# Patient Record
Sex: Female | Born: 1955
Health system: Southern US, Community
[De-identification: ages and names within clinical notes are randomized; demographics above are authoritative.]

## PROBLEM LIST (undated history)

## (undated) DIAGNOSIS — I10 Essential (primary) hypertension: Secondary | ICD-10-CM

## (undated) DIAGNOSIS — M1990 Unspecified osteoarthritis, unspecified site: Secondary | ICD-10-CM

## (undated) DIAGNOSIS — F419 Anxiety disorder, unspecified: Secondary | ICD-10-CM

## (undated) DIAGNOSIS — K219 Gastro-esophageal reflux disease without esophagitis: Secondary | ICD-10-CM

## (undated) DIAGNOSIS — F32A Depression, unspecified: Secondary | ICD-10-CM

## (undated) DIAGNOSIS — F329 Major depressive disorder, single episode, unspecified: Secondary | ICD-10-CM

## (undated) DIAGNOSIS — Z9989 Dependence on other enabling machines and devices: Secondary | ICD-10-CM

## (undated) DIAGNOSIS — G4733 Obstructive sleep apnea (adult) (pediatric): Secondary | ICD-10-CM

## (undated) HISTORY — DX: Dependence on other enabling machines and devices: Z99.89

## (undated) HISTORY — PX: TONSILLECTOMY: SUR1361

## (undated) HISTORY — DX: Major depressive disorder, single episode, unspecified: F32.9

## (undated) HISTORY — DX: Unspecified osteoarthritis, unspecified site: M19.90

## (undated) HISTORY — DX: Essential (primary) hypertension: I10

## (undated) HISTORY — DX: Gastro-esophageal reflux disease without esophagitis: K21.9

## (undated) HISTORY — DX: Depression, unspecified: F32.A

## (undated) HISTORY — PX: LIPOMA EXCISION: SHX5283

## (undated) HISTORY — DX: Anxiety disorder, unspecified: F41.9

## (undated) HISTORY — PX: OTHER SURGICAL HISTORY: SHX169

## (undated) HISTORY — PX: ROTATOR CUFF REPAIR: SHX139

## (undated) HISTORY — DX: Obstructive sleep apnea (adult) (pediatric): G47.33

---

## 2016-09-15 ENCOUNTER — Other Ambulatory Visit: Payer: Self-pay | Admitting: Internal Medicine

## 2016-09-15 DIAGNOSIS — Z1231 Encounter for screening mammogram for malignant neoplasm of breast: Secondary | ICD-10-CM

## 2016-10-08 ENCOUNTER — Ambulatory Visit
Admission: RE | Admit: 2016-10-08 | Discharge: 2016-10-08 | Disposition: A | Discharge status: Home / Self Care | Payer: 59 | Source: Ambulatory Visit | Attending: Internal Medicine | Admitting: Internal Medicine

## 2016-10-08 DIAGNOSIS — Z1231 Encounter for screening mammogram for malignant neoplasm of breast: Secondary | ICD-10-CM

## 2016-10-11 HISTORY — PX: BREAST BIOPSY: SHX20

## 2016-10-27 ENCOUNTER — Institutional Professional Consult (permissible substitution): Payer: 59 | Admitting: Neurology

## 2016-10-28 ENCOUNTER — Other Ambulatory Visit: Payer: Self-pay | Admitting: Internal Medicine

## 2016-10-28 DIAGNOSIS — R928 Other abnormal and inconclusive findings on diagnostic imaging of breast: Secondary | ICD-10-CM

## 2016-10-29 ENCOUNTER — Telehealth: Payer: Self-pay

## 2016-10-29 NOTE — Telephone Encounter (Signed)
I called pt. She is agreeable to rescheduling her appt for 11/02/2016 at 2:00pm. Pt verbalized understanding understanding of new appt date and time.

## 2016-10-29 NOTE — Telephone Encounter (Signed)
I called pt to advise her that her appt on 10/27/16 will need to be rescheduled due to weather. Pt verbalized understanding.

## 2016-11-01 ENCOUNTER — Ambulatory Visit
Admission: RE | Admit: 2016-11-01 | Discharge: 2016-11-01 | Disposition: A | Discharge status: Home / Self Care | Payer: 59 | Source: Ambulatory Visit | Attending: Internal Medicine | Admitting: Internal Medicine

## 2016-11-01 ENCOUNTER — Other Ambulatory Visit: Payer: Self-pay | Admitting: Internal Medicine

## 2016-11-01 DIAGNOSIS — R928 Other abnormal and inconclusive findings on diagnostic imaging of breast: Secondary | ICD-10-CM

## 2016-11-01 DIAGNOSIS — R921 Mammographic calcification found on diagnostic imaging of breast: Secondary | ICD-10-CM | POA: Diagnosis not present

## 2016-11-02 ENCOUNTER — Ambulatory Visit (INDEPENDENT_AMBULATORY_CARE_PROVIDER_SITE_OTHER): Payer: 59 | Admitting: Neurology

## 2016-11-02 ENCOUNTER — Encounter: Payer: Self-pay | Admitting: Neurology

## 2016-11-02 VITALS — BP 134/72 | HR 57 | Resp 20 | Ht 66.0 in | Wt 249.0 lb

## 2016-11-02 DIAGNOSIS — G4733 Obstructive sleep apnea (adult) (pediatric): Secondary | ICD-10-CM

## 2016-11-02 DIAGNOSIS — Z9989 Dependence on other enabling machines and devices: Secondary | ICD-10-CM | POA: Diagnosis not present

## 2016-11-02 NOTE — Progress Notes (Signed)
SLEEP MEDICINE CLINIC   Provider:  Larey Seat, M D  Referring Provider: Haywood Pao, MD Primary Care Physician:  Haywood Pao, MD  Chief Complaint  Patient presents with  . New Patient (Initial Visit)    on cpap for 10 years    HPI:  Brittany Henson is a 61 y.o. female , seen here as a referral from Dr. Osborne Casco for transfer of sleep care,  Mrs. Wilms moved to  [place] , from [place] last year and needs to answer her sleep care. Her machine is a little over 87 years old, a ResMed machine S 8 provided by [employer]. It set at 11 cm water without ramp and was last set on 02/05/2007. She has been a compliant CPAP user, endorsed the Epworth Sleepiness Scale while on treatment at one point and fatigue severity at 40 points. She endorsed further coughing, some hearing loss, joint pain, rhinitis and a history of depression and hypertension and GERD. Just undergone tonsillectomy in 1962, rotator cuff repair on the right in 2015, and a lipoma was removed from the neck 2016.  Chief complaint according to patient : I need a new CPAP.   Sleep habits are as follows: She goes to bed at about 11:00 at night, and she sleeps usually until about 8 AM she can sleep through the night on CPAP. No nocturia breaks but she had at least 2 bathroom breaks before she was started on CPAP. She keeps her bedroom cool, quiet and there are some night lights. She sleeps usually on 2 pillows for neck support and sleeps on her back. She awakens in the morning with out dry mouth or headaches and usually does dream, she does not have palpitations or diaphoresis. Again, this is her sleep experience since using CPAP.  Sleep medical history and family sleep history:  Her mother use to snore loudly but was never officially diagnosed with sleep apnea, she died at age 29 from a stroke.patient had panic attacks at night, depression .   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  She states that she gained weight once she stopped working regularly. Coffee one cup a day, iced tea,  Sweet -  4 glasses a day, Sodas : seldomly.  ETOH : None, tobacco use; none No history of night shift work. Married, no children. She owns 2 dogs and 2 cats.  Review of Systems: Out of a complete 14 system review, the patient complains of only the following symptoms, and all other reviewed systems are negative. Epworth score  1, Fatigue severity score 40, depression score 3/10 . Hypersomnia before CPAP. Nocturia before CPAP.    Social History   Social History  . Marital status: Married    Spouse name: N/A  . Number of children: N/A  . Years of education: N/A   Occupational History  . Not on file.   Social History Main Topics  . Smoking status: Never Smoker  . Smokeless tobacco: Never Used  . Alcohol use No  . Drug use: No  . Sexual activity: Not on file   Other Topics Concern  . Not on file   Social History Narrative  . No narrative on file    Family History  Problem Relation Age of Onset  . Arthritis Mother   . Asthma Mother   . Depression Mother   . Heart disease Mother   . Hyperlipidemia Mother   . Seizures Mother   . Stroke  Mother   . Hypertension Father   . Diabetes Maternal Aunt   . Heart disease Maternal Grandmother   . Breast cancer Maternal Grandmother     Past Medical History:  Diagnosis Date  . Anxiety   . Depression   . GERD (gastroesophageal reflux disease)   . Hypertension   . OSA on CPAP   . Osteoarthritis     Past Surgical History:  Procedure Laterality Date  . LIPOMA EXCISION    . ROTATOR CUFF REPAIR    . TONSILLECTOMY    . uterine fibroidectomy      Current Outpatient Prescriptions  Medication Sig Dispense Refill  . bisoprolol-hydrochlorothiazide (ZIAC) 2.5-6.25 MG tablet Take 1 tablet by mouth daily.    Marland Kitchen esomeprazole (NEXIUM) 40 MG capsule Take 40 mg by mouth daily at 12 noon.    Marland Kitchen FLUoxetine (PROZAC) 40 MG capsule Take 40  mg by mouth daily.     No current facility-administered medications for this visit.     Allergies as of 11/02/2016 - Review Complete 11/02/2016  Allergen Reaction Noted  . Erythromycin Nausea And Vomiting 09/10/2011    Vitals: BP 134/72   Pulse (!) 57   Resp 20   Ht 5\' 6"  (1.676 m)   Wt 249 lb (112.9 kg)   BMI 40.19 kg/m  Last Weight:  Wt Readings from Last 1 Encounters:  11/02/16 249 lb (112.9 kg)   [id number] mass index is 40.19 kg/m.     Last Height:   Ht Readings from Last 1 Encounters:  11/02/16 5\' 6"  (1.676 m)    Physical exam:  General: The patient is awake, alert and appears not in acute distress. The patient is well groomed. Head: Normocephalic, atraumatic. Neck is supple. Mallampati 4,  neck circumference: 16. Nasal airflow patent , Retrognathia is not seen.  Cardiovascular:  Regular rate and rhythm , without  murmurs or carotid bruit, and without distended neck veins. Respiratory: Lungs are clear to auscultation. Skin:  Without evidence of edema, or rash Trunk: BMI is super obese . The patient's posture is erect   Neurologic exam : The patient is awake and alert, oriented to place and time.   Attention span & concentration ability appears normal.  Speech is fluent,  without  dysarthria, dysphonia or aphasia.  Mood and affect are appropriate.  Cranial nerves: no loss of smell or taste  Pupils are equal and briskly reactive to light. Funduscopic exam without evidence of pallor or edema. Extraocular movements  in vertical and horizontal planes intact and without nystagmus. Visual fields by finger perimetry are intact. Hearing to finger rub intact. Facial sensation intact to fine touch.Facial motor strength is symmetric and tongue and uvula move midline. Shoulder shrug : The right shoulder is droopier than the left sits lower at baseline, this is the site where she had rotator injuries to be surgically repaired Motor exam:   Normal tone, muscle bulk and symmetric  strength in all extremities. Sensory:  Fine touch, pinprick and vibration were tested in all extremities. Proprioception tested in the upper extremities was normal. Coordination:Finger-to-nose maneuver  normal without evidence of ataxia, dysmetria . Action tremor in left arm and hand.  Gait and station: Patient walks without assistive device and is able unassisted to climb up to the exam table. Strength within normal limits.  Stance is stable and normal.   Deep tendon reflexes: in the  upper and lower extremities are symmetric and intact. Babinski maneuver response is  downgoing.  The patient was  advised of the nature of the diagnosed sleep disorder , the treatment options and risks for general a health and wellness arising from not treating the condition.  I spent more than 45  minutes of face to face time with the patient. Greater than 50% of time was spent in counseling and coordination of care. We have discussed the diagnosis and differential and I answered the patient's questions.     Assessment:  After physical and neurologic examination, review of laboratory studies,  Personal review of imaging studies, reports of other /same  Imaging studies ,  Results of a polysomnography were not available.  Neurophysiology testing and pre-existing records as far as provided in visit., my assessment is   1) Mrs. Trani is likely to still have sleep apnea, given that she gained about 35 pounds since she was initially diagnosed with sleep apnea 10 years ago. In addition she is using a CPAP is great compliance but cannot provide therapeutic or compliance data, both are requirements by now for most insurances. For this reason I will ask Mrs. Digirolamo to come in for a split night polysomnography so we can establish the baseline degree of apnea and titrated her to CPAP if necessary. I will order the sleep study to be performed at [facility] sleep at [facility], in addition I will give the patient some advice about  weight loss and sleep hygiene. For the most part she is adhering to the basic rules very well. I will ask her to reduce caffeine intake and to use caffeine in any form he for lunchtime but not into the afternoon.  Her risk factors further include her neck size, in the upper airway shape and BMI.   Plan:  Treatment plan and additional workup :  Split  and  retitration.    Asencion Partridge Bertine Schlottman MD  11/02/2016   CC: Haywood Pao, [place] [place], [place] [address]

## 2016-11-02 NOTE — Patient Instructions (Signed)

## 2016-11-04 ENCOUNTER — Ambulatory Visit
Admission: RE | Admit: 2016-11-04 | Discharge: 2016-11-04 | Disposition: A | Discharge status: Home / Self Care | Payer: 59 | Source: Ambulatory Visit | Attending: Internal Medicine | Admitting: Internal Medicine

## 2016-11-04 ENCOUNTER — Other Ambulatory Visit: Payer: Self-pay | Admitting: Internal Medicine

## 2016-11-04 DIAGNOSIS — R928 Other abnormal and inconclusive findings on diagnostic imaging of breast: Secondary | ICD-10-CM

## 2016-11-04 DIAGNOSIS — R921 Mammographic calcification found on diagnostic imaging of breast: Secondary | ICD-10-CM | POA: Diagnosis not present

## 2016-11-04 DIAGNOSIS — D242 Benign neoplasm of left breast: Secondary | ICD-10-CM | POA: Diagnosis not present

## 2016-11-24 ENCOUNTER — Ambulatory Visit (INDEPENDENT_AMBULATORY_CARE_PROVIDER_SITE_OTHER): Payer: 59 | Admitting: Neurology

## 2016-11-24 DIAGNOSIS — G4733 Obstructive sleep apnea (adult) (pediatric): Secondary | ICD-10-CM | POA: Diagnosis not present

## 2016-11-24 DIAGNOSIS — Z9989 Dependence on other enabling machines and devices: Secondary | ICD-10-CM

## 2016-11-29 ENCOUNTER — Telehealth: Payer: Self-pay | Admitting: Neurology

## 2016-11-29 DIAGNOSIS — G4733 Obstructive sleep apnea (adult) (pediatric): Secondary | ICD-10-CM

## 2016-11-29 DIAGNOSIS — G4734 Idiopathic sleep related nonobstructive alveolar hypoventilation: Secondary | ICD-10-CM

## 2016-11-29 DIAGNOSIS — G4736 Sleep related hypoventilation in conditions classified elsewhere: Secondary | ICD-10-CM

## 2016-11-29 DIAGNOSIS — E669 Obesity, unspecified: Secondary | ICD-10-CM

## 2016-11-29 NOTE — Telephone Encounter (Signed)
Per insurance, AutoPAP is recommended.

## 2016-11-29 NOTE — Telephone Encounter (Signed)
Brittany Henson , please call and send report as always to referring physician. I ordered CPAP titration return, if denied will need auto-titration.  STUDY RESULTS: Total Recording Time: 9h 24m Total Apnea/Hypopnea Index (AHI) 56.4/hr. Average Oxygen Saturation: SpO2 at 91% Lowest Oxygen Saturation: nadir SpO2 at 78%, duration at or below 88% was 89 minutes! Average Heart Rate: 52 bpm, variable between 42 and 81 bpm  IMPRESSION:  Severest apnea, presumed to be obstructive, with severe oxygen desaturation (prolonged hypoxemia) and frequent desaturation events.  RECOMMENDATION: immediate need for positive airway pressure therapy, possibly additional Oxygen needed. I certify that I have reviewed the raw data recording prior to the issuance of this report in accordance with the standards of Accreditation of the [place] [employer] of [employer] medicine (AASM) Larey Seat, MD  11-29-2016

## 2016-11-29 NOTE — Telephone Encounter (Signed)
Can I get an Auto-PAP order?

## 2016-11-29 NOTE — Procedures (Signed)
[facility] [address] [place], [place] [address] NAME: [name],   DOB: [birth year] MEDICAL RECORD [id number]   DOS: 11/24/16 REFERRING PHYSICIAN: Domenick Gong, MD Study performed: HST/Out of Center Sleep test HISTORY: Brittany Henson is a 61 y.o. female patient, who presents with the need for a new CPAP machine. Her machine is a little over 68 years old, a ResMed machine S 8 and is set at 11 cm water without ramp (and was last set on 02/05/2007!). Prior to CPAP use, she reported palpitations and diaphoresis at night. Her BMI is over 40.   She has been a compliant CPAP user, endorsed the Epworth Sleepiness Scale while on treatment at one point and fatigue severity at 40 points. She endorsed further coughing, some hearing loss, joint pain, rhinitis, depression, hypertension and GERD. She had undergone tonsillectomy in 1962, a rotator cuff repair on the right in 2015, and a lipoma was removed from the neck 2016.    Epworth Sleepiness score endorsed at 1 point, Fatigue severity score at 40, PHQ at 2 points.  STUDY RESULTS: Total Recording Time: 9h 23m Total Apnea/Hypopnea Index (AHI) 56.4/hr. Average Oxygen Saturation: SpO2 at 91% Lowest Oxygen Saturation: nadir SpO2 at 78%, duration at or below 88% was 89 minutes! Average Heart Rate: 52 bpm, variable between 42 and 81 bpm  IMPRESSION:  Severest apnea, presumed to be obstructive, with severe oxygen desaturation (prolonged hypoxemia) and frequent desaturation events.  RECOMMENDATION: immediate need for positive airway pressure therapy, possibly additional Oxygen needed. I certify that I have reviewed the raw data recording prior to the issuance of this report in accordance with the standards of Accreditation of the American Academy of Sleep medicine (AASM) Larey Seat, MD  11-29-2016 Diplomat, American Board of Psychiatry and Neurology, American Board of Sleep Medicine Medical Director of [facility]  Sleep at [employer]

## 2016-11-29 NOTE — Telephone Encounter (Signed)
[employer] denied CPAP suggest autopap. °

## 2016-11-30 NOTE — Addendum Note (Signed)
Addended by: Larey Seat on: 11/30/2016 05:09 PM   Modules accepted: Orders

## 2016-12-01 NOTE — Telephone Encounter (Signed)
I spoke to patient and she is aware of results and agreed to send orders to [facility].

## 2016-12-01 NOTE — Telephone Encounter (Signed)
Pt returned RN's call °

## 2016-12-01 NOTE — Telephone Encounter (Signed)
-----   Message from Larey Seat, MD sent at 11/29/2016  8:17 AM EST ----- Patient still needs CPAP, and may need additional oxygen. I would like her to have an attended PAP titration with the option of oxygen being added.   STUDY RESULTS: Total Recording Time: 9h 32m Total Apnea/Hypopnea Index (AHI) 56.4/hr. Average Oxygen Saturation: SpO2 at 91% Lowest Oxygen Saturation: nadir SpO2 at 78%, duration at or below 88% was 89 minutes! Average Heart Rate: 52 bpm, variable between 42 and 81 bpm  IMPRESSION:  Severest apnea, presumed to be obstructive, with severe oxygen desaturation (prolonged hypoxemia) and frequent desaturation events.  RECOMMENDATION: immediate need for positive airway pressure therapy, possibly additional Oxygen needed. I certify that I have reviewed the raw data recording prior to the issuance of this report in accordance with the standards of Accreditation of the [place] Academy of Sleep medicine (AASM) Larey Seat, MD  11-29-2016

## 2016-12-01 NOTE — Telephone Encounter (Signed)
Titration study denied, Auto-PAP required.  LM for patient to call back

## 2017-01-10 DIAGNOSIS — G4733 Obstructive sleep apnea (adult) (pediatric): Secondary | ICD-10-CM | POA: Diagnosis not present

## 2017-02-09 DIAGNOSIS — G4733 Obstructive sleep apnea (adult) (pediatric): Secondary | ICD-10-CM | POA: Diagnosis not present

## 2017-02-21 ENCOUNTER — Encounter: Payer: Self-pay | Admitting: Neurology

## 2017-02-21 ENCOUNTER — Ambulatory Visit (INDEPENDENT_AMBULATORY_CARE_PROVIDER_SITE_OTHER): Payer: 59 | Admitting: Neurology

## 2017-02-21 VITALS — BP 141/71 | HR 55 | Ht 66.0 in | Wt 249.0 lb

## 2017-02-21 DIAGNOSIS — Z9989 Dependence on other enabling machines and devices: Secondary | ICD-10-CM | POA: Diagnosis not present

## 2017-02-21 DIAGNOSIS — G4733 Obstructive sleep apnea (adult) (pediatric): Secondary | ICD-10-CM

## 2017-02-21 DIAGNOSIS — G4734 Idiopathic sleep related nonobstructive alveolar hypoventilation: Secondary | ICD-10-CM | POA: Insufficient documentation

## 2017-02-21 NOTE — Patient Instructions (Signed)
Obesity Hypoventilation Syndrome ° °Obesity hypoventilation syndrome (OHS) means that you are not breathing well enough to get air in and out of your lungs efficiently (ventilation). This causes a low oxygen level and a high carbon dioxide level in your blood (hypoventilation). Having too much total body fat (obesity) is a significant risk factor for developing OHS. °OHS makes it harder for your heart to pump oxygen-rich blood to your body. It can cause sleep disturbances and make you feel sleepy during the day. Over time, OHS can increase your risk for: °· Heart disease. °· High blood pressure (hypertension). °· Reduced ability to absorb sugar from the bloodstream (insulin resistance). °· Heart failure. Over time, OHS weakens your heart and can lead to heart failure. °What are the causes? °The exact cause of OHS is not known. Possible causes include: °· Pressure on the lungs from excess body weight. °· Obesity-related changes in how much air the lungs can hold (lung capacity) and how much they can expand (lung compliance). °· Failure of the brain to regulate oxygen and carbon dioxide levels properly. °· Chemicals (hormones) produced by excess fat cells interfering with breathing regulation. °· A breathing condition in which breathing pauses or becomes shallow during sleep (sleep apnea). This condition can eventually cause the body to ventilate poorly and to hold onto carbon dioxide during the day. °What increases the risk? °You may have a greater risk for OHS if you: °· Have a BMI of 30 or higher. BMI is an estimate of body fat that is calculated from height and weight. For adults, a BMI of 30 or higher is considered obese. °· Are 40?60 years old. °· Carry most of your excess weight around your waist. °· Experience moderate symptoms of sleep apnea. °What are the signs or symptoms? °The most common symptoms of OHS are: °· Daytime sleepiness. °· Lack of energy. °· Shortness of breath. °· Morning headaches. °· Sleep  apnea. °· Trouble concentrating. °· Irritability, mood swings, or depression. °· Swollen veins in the neck. °· Swelling of the legs. °How is this diagnosed? °Your health care provider may suspect OHS if you are obese and have poor breathing during the day and at night. Your health care provider will also do a physical exam. You may have tests to: °· Measure your BMI. °· Measure your blood oxygen level with a sensor placed on your finger (pulse oximetry). °· Measure blood oxygen and carbon dioxide in a blood sample. °· Measure the amount of red blood cells in a blood sample. OHS causes the number of red blood cells you have to increase (polycythemia). °· Check your breathing ability (pulmonary function testing). °· Check your breathing ability, breathing patterns, and oxygen level while you sleep (sleep study). °You may also have a chest X-ray to rule out other breathing problems. You may have an electrocardiogram (ECG) and or echocardiogram to check for signs of heart failure. °How is this treated? °Weight loss is the most important part of treatment for OHS, and it may be the only treatment that you need. Other treatments may include: °· Using a device to open your airway while you sleep, such as a continuous positive airway pressure (CPAP) machine that delivers oxygen to your airway through a mask. °· Surgery (gastric bypass surgery) to lower your BMI. This may be needed if: °¨ You are very obese. °¨ Other treatments have not worked for you. °¨ Your OHS is very severe and is causing organ damage, such as heart failure. °Follow these   instructions at home: °Medicines  °· Take over-the-counter and prescription medicines only as told by your health care provider. °· Ask your health care provider what medicines are safe for you. You may be told to avoid medicines that can impair breathing and make OHS worse, such as sedatives and narcotics. °Sleeping habits  °· If you are prescribed a CPAP machine, make sure you  understand and use the machine as directed. °· Try to get 8 hours of sleep every night. °· Go to bed at the same time every night, and get up at the same time every day. °General instructions  °· Work with your health care provider to make a diet and exercise plan that helps you reach and maintain a healthy weight. °· Eat a healthy diet. °· Avoid smoking. °· Exercise regularly as told by your health care provider. °· During the evening, do not drink caffeine and do not eat heavy meals. °· Keep all follow-up visits as told by your health care provider. This is important. °Contact a health care provider if: ° °· You experience new or worsening shortness of breath. °· You have chest pain. °· You have an irregular heartbeat (palpitations). °· You have dizziness. °· You faint. °· You develop a cough. °· You have a fever. °· You have chest pain when you breathe (pleurisy). °This information is not intended to replace advice given to you by your health care provider. Make sure you discuss any questions you have with your health care provider. °Document Released: 03/08/2016 Document Revised: 04/16/2016 Document Reviewed: 03/08/2016 °Elsevier Interactive Patient Education © 2017 [employer] ° °

## 2017-02-21 NOTE — Progress Notes (Signed)
SLEEP MEDICINE CLINIC   Provider:  Larey Seat, M D  Referring Provider: Haywood Pao, MD Primary Care Physician:  Haywood Pao, MD  Chief Complaint  Patient presents with  . Follow-up    likes the new cpap    HPI:  Brittany Henson is a 61 y.o. female , was seen here as a referral from Dr. Osborne Casco for transfer of sleep care,and now follows after a HST.    14th of May 2018, I have the pleasure of seeing Brittany Henson today in a revisit following her home sleep test dated 14th of February 2018. The patient's risk factors for apnea were described in her initial consultation the patient had used a machine that was close to 59 years old with the same setting for this decade. She appeared a compliant CPAP user. The AHI with out CPAP was 56.4 per hour of sleep this is a severe form of apnea oxygen nadir was 78% and the duration at or below 88% saturation was 89 minutes heart rate was not very variable between 42 and 81 bpm my impression was that the patient is a severest of anemia and I would like to have seen her in a titration attended in the sleep lab as I fear that she needs additional oxygen. However her insurance declined and therefore an autotitrator had to be ordered. Her compliance download on an AutoSet between 5 and 20 cm water with 27 m EPR shows 100% compliance for 9 hours and 28 minutes of daily use on average. A residual AHI is 1.5, which is an excellent sign of resolution of apnea. She does have very little air leaks. I would like for her to have one night with an overnight pulse oximetry to confirm that her oxygen levels have been treated along with her apnea.  Otherwise I have no reason to change settings.  ROS _ Subjectively the patient endorsed the Epworth sleepiness score at one point, declared her love for her new CPAP machine!  She is not fatigued and she wakes up refreshed and restored.    Brittany Henson moved to  [place] , from [place] last year and needs to  transfer  her sleep care. Her machine is a little over 65 years old, a ResMed machine S 8 provided by fresh air. It set at 11 cm water without ramp and was last set on 02/05/2007. She has been a compliant CPAP user, endorsed the Epworth Sleepiness Scale while on treatment at one point and fatigue severity at 40 points. She endorsed further coughing, some hearing loss, joint pain, rhinitis and a history of depression and hypertension and GERD. Just undergone tonsillectomy in 1962, rotator cuff repair on the right in 2015, and a lipoma was removed from the neck 2016 Sleep habits are as follows:She goes to bed at about 11:00 at night, and she sleeps usually until about 8 AM she can sleep through the night on CPAP. No nocturia breaks but she had at least 2 bathroom breaks before she was started on CPAP. She keeps her bedroom cool, quiet and there are some night lights. She sleeps usually on 2 pillows for neck support and sleeps on her back. She awakens in the morning with out dry mouth or headaches and usually does dream, she does not have palpitations or diaphoresis. Again, this is her sleep experience since using CPAP. Sleep medical history and family sleep history:  Her mother use to snore loudly but was never officially diagnosed with sleep apnea,  she died at age 3 from a stroke.patient had panic attacks at night, depression.  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 She states that she gained weight once she stopped working regularly. Coffee one cup a day, iced tea,  Sweet tea -  4 glasses a day, Sodas : seldomly.  ETOH : None, tobacco use; none No history of night shift work. Married, no children. She owns 2 dogs and 2 cats.  Review of Systems: Out of a complete 14 system review, the patient complains of only the following symptoms, and all other reviewed systems are negative. Epworth score  1, Fatigue severity score 20, depression score 4 /15 . Apathy- feels  less willingness to clean, less motivation to keep house. Feels burdened. On prozac.   Hypersomnia before CPAP. Nocturia before CPAP. Both resolved.    Social History   Social History  . Marital status: Married    Spouse name: N/A  . Number of children: N/A  . Years of education: N/A   Occupational History  . Not on file.   Social History Main Topics  . Smoking status: Never Smoker  . Smokeless tobacco: Never Used  . Alcohol use No  . Drug use: No  . Sexual activity: Not on file   Other Topics Concern  . Not on file   Social History Narrative  . No narrative on file    Family History  Problem Relation Age of Onset  . Arthritis Mother   . Asthma Mother   . Depression Mother   . Heart disease Mother   . Hyperlipidemia Mother   . Seizures Mother   . Stroke Mother   . Hypertension Father   . Diabetes Maternal Aunt   . Heart disease Maternal Grandmother   . Breast cancer Maternal Grandmother     Past Medical History:  Diagnosis Date  . Anxiety   . Depression   . GERD (gastroesophageal reflux disease)   . Hypertension   . OSA on CPAP   . Osteoarthritis     Past Surgical History:  Procedure Laterality Date  . LIPOMA EXCISION    . ROTATOR CUFF REPAIR    . TONSILLECTOMY    . uterine fibroidectomy      Current Outpatient Prescriptions  Medication Sig Dispense Refill  . bisoprolol-hydrochlorothiazide (ZIAC) 2.5-6.25 MG tablet Take 1 tablet by mouth daily.    Marland Kitchen esomeprazole (NEXIUM) 40 MG capsule Take 40 mg by mouth daily at 12 noon.    Marland Kitchen FLUoxetine (PROZAC) 40 MG capsule Take 40 mg by mouth daily.     No current facility-administered medications for this visit.     Allergies as of 02/21/2017 - Review Complete 02/21/2017  Allergen Reaction Noted  . Erythromycin Nausea And Vomiting 09/10/2011    Vitals: BP (!) 141/71   Pulse (!) 55   Ht 5\' 6"  (1.676 m)   Wt 249 lb (112.9 kg)   BMI 40.19 kg/m  Last Weight:  Wt Readings from Last 1 Encounters:    02/21/17 249 lb (112.9 kg)   ERX:VQMG mass index is 40.19 kg/m.     Last Height:   Ht Readings from Last 1 Encounters:  02/21/17 5\' 6"  (1.676 m)    Physical exam:  General: The patient is awake, alert and appears not in acute distress. The patient is well groomed. Head: Normocephalic, atraumatic. Neck is supple. Mallampati 4,  neck circumference: 16. Nasal airflow patent , Retrognathia is not seen.  Cardiovascular:  Regular rate and rhythm , without  murmurs or carotid bruit, and without distended neck veins. Respiratory: Lungs are clear to auscultation. Skin:  Without evidence of edema, or rash Trunk: BMI is super obese . The patient's posture is erect   Neurologic exam : The patient is awake and alert, oriented to place and time.   Attention span & concentration ability appears normal.  Speech is fluent,  without  dysarthria, dysphonia or aphasia.  Mood and affect are appropriate.  Cranial nerves: no loss of smell or taste  Pupils are equal and briskly reactive to light. Extraocular movements  in vertical and horizontal planes intact and without nystagmus. Visual fields by finger perimetry are intact. Hearing to finger rub intact. Facial sensation intact to fine touch. Facial motor strength is symmetric, her  tongue and uvula move midline. No tremor there.  Shoulder shrug : The right shoulder is droopier than the left sits lower at baseline, this is the site where she had rotator injuries to be surgically repaired Motor exam:   Normal tone, muscle bulk and symmetric strength in all extremities. Sensory:  Fine touch, pinprick and vibration were normal. Coordination:Finger-to-nose maneuver  normal . Status post surgery surgery on the right- she noted swelling in her right hand, her dominant hand.   The patient was advised of the nature of the diagnosed sleep disorder , the treatment options and risks for general a health and wellness arising from not treating the condition.  I spent  more than 20 minutes of face to face time with the patient. Greater than 50% of time was spent in counseling and coordination of care. We have discussed the diagnosis and differential and I answered the patient's questions.     Assessment:  After physical and neurologic examination, review of laboratory studies,  Personal review of imaging studies, reports of other /same  Imaging studies ,  Results of a polysomnography were not available.  Neurophysiology testing and pre-existing records as far as provided in visit., my assessment is   1) Mrs. Marcos is likely to still have sleep apnea, given that she gained about 35 pounds since she was initially diagnosed with sleep apnea 10 years ago.  Her sleep study confirmed this. Her HST documented a severe AHI of 56 nadir at 78% , and 90 minutes of desaturation time. I would have much preferred a in-lab study.  I will leave her on auto titration. But added an ONO- overnight pulsoximetry to be used with the CPAP at home for one night.     Plan:  Treatment plan and additional workup : ONO and follow up with me or NP in 6 weeks. After that, will follow yearly  Larey Seat MD  02/21/2017   CC: Haywood Pao, [place], [place] [id number]

## 2017-03-04 DIAGNOSIS — R229 Localized swelling, mass and lump, unspecified: Secondary | ICD-10-CM | POA: Diagnosis not present

## 2017-03-04 DIAGNOSIS — G4733 Obstructive sleep apnea (adult) (pediatric): Secondary | ICD-10-CM | POA: Diagnosis not present

## 2017-03-09 ENCOUNTER — Telehealth: Payer: Self-pay

## 2017-03-09 NOTE — Telephone Encounter (Signed)
I called pt. Dr. Brett Fairy reviewed her ONO results and advised her that no additional oxygen is needed at this time. Pt verbalized understanding of results. Pt had no questions at this time but was encouraged to call back if questions arise.

## 2017-03-12 DIAGNOSIS — G4733 Obstructive sleep apnea (adult) (pediatric): Secondary | ICD-10-CM | POA: Diagnosis not present

## 2017-03-14 DIAGNOSIS — Z0000 Encounter for general adult medical examination without abnormal findings: Secondary | ICD-10-CM | POA: Diagnosis not present

## 2017-03-21 DIAGNOSIS — Z1389 Encounter for screening for other disorder: Secondary | ICD-10-CM | POA: Diagnosis not present

## 2017-03-21 DIAGNOSIS — Z0000 Encounter for general adult medical examination without abnormal findings: Secondary | ICD-10-CM | POA: Diagnosis not present

## 2017-03-21 DIAGNOSIS — E559 Vitamin D deficiency, unspecified: Secondary | ICD-10-CM | POA: Diagnosis not present

## 2017-03-21 DIAGNOSIS — Z23 Encounter for immunization: Secondary | ICD-10-CM | POA: Diagnosis not present

## 2017-03-21 DIAGNOSIS — I10 Essential (primary) hypertension: Secondary | ICD-10-CM | POA: Diagnosis not present

## 2017-03-22 DIAGNOSIS — Z1212 Encounter for screening for malignant neoplasm of rectum: Secondary | ICD-10-CM | POA: Diagnosis not present

## 2017-04-11 DIAGNOSIS — G4733 Obstructive sleep apnea (adult) (pediatric): Secondary | ICD-10-CM | POA: Diagnosis not present

## 2017-05-12 DIAGNOSIS — G4733 Obstructive sleep apnea (adult) (pediatric): Secondary | ICD-10-CM | POA: Diagnosis not present

## 2017-05-16 ENCOUNTER — Encounter (INDEPENDENT_AMBULATORY_CARE_PROVIDER_SITE_OTHER): Payer: Self-pay

## 2017-05-16 ENCOUNTER — Ambulatory Visit (INDEPENDENT_AMBULATORY_CARE_PROVIDER_SITE_OTHER): Payer: 59 | Admitting: Adult Health

## 2017-05-16 ENCOUNTER — Encounter: Payer: Self-pay | Admitting: Adult Health

## 2017-05-16 VITALS — BP 123/66 | HR 58 | Wt 251.0 lb

## 2017-05-16 DIAGNOSIS — G4733 Obstructive sleep apnea (adult) (pediatric): Secondary | ICD-10-CM | POA: Diagnosis not present

## 2017-05-16 DIAGNOSIS — Z9989 Dependence on other enabling machines and devices: Secondary | ICD-10-CM | POA: Diagnosis not present

## 2017-05-16 NOTE — Patient Instructions (Signed)
Continue using CPAP nightly If your symptoms worsen or you develop new symptoms please let us know.   

## 2017-05-16 NOTE — Progress Notes (Signed)
PATIENT: Courtnei Mcmahill DOB: 01/30/1956  REASON FOR VISIT: follow up- OSA on CPAP HISTORY FROM: patient  HISTORY OF PRESENT ILLNESS:  Today 05/16/17 Ms. Sanjuan is a 61 year old female with a history of obstructive sleep apnea on CPAP. She returns today for follow-up. Her download indicates that she uses her machine 30 out of 30 days for compliance of 90%. She uses her machine greater than 4 hours each night. On average she uses her machine 10 hours and 2 minutes. She is on a minimum pressure of 5 cm of water and max pressure 20 cm of water. Her residual AHI is 2.8. She does not have a significant leak. She states that the CPAP continues to work well for her. She reports that if she does not use it one night she feels terrible the next day. She did have an overnight pulse oximetry that was normal. She returns today for an evaluation.   HISTORY: Copied from Dr. Edwena Felty notes: Seda Kronberg is a 61 y.o. female , was seen here as a referral from Dr. Osborne Casco for transfer of sleep care,and now follows after a HST.  02/21/17: I have the pleasure of seeing Mrs. Vitelli today in a revisit following her home sleep test dated 14th of February 2018. The patient's risk factors for apnea were described in her initial consultation the patient had used a machine that was close to 59 years old with the same setting for this decade. She appeared a compliant CPAP user. The AHI with out CPAP was 56.4 per hour of sleep this is a severe form of apnea oxygen nadir was 78% and the duration at or below 88% saturation was 89 minutes heart rate was not very variable between 42 and 81 bpm my impression was that the patient is a severest of anemia and I would like to have seen her in a titration attended in the sleep lab as I fear that she needs additional oxygen. However her insurance declined and therefore an autotitrator had to be ordered. Her compliance download on an AutoSet between 5 and 20 cm water with 27 m EPR  shows 100% compliance for 9 hours and 28 minutes of daily use on average. A residual AHI is 1.5, which is an excellent sign of resolution of apnea. She does have very little air leaks. I would like for her to have one night with an overnight pulse oximetry to confirm that her oxygen levels have been treated along with her apnea.  Otherwise I have no reason to change settings.  ROS _ Subjectively the patient endorsed the Epworth sleepiness score at one point, declared her love for her new CPAP machine!  She is not fatigued and she wakes up refreshed and restored.   REVIEW OF SYSTEMS: Out of a complete 14 system review of symptoms, the patient complains only of the following symptoms, and all other reviewed systems are negative.  See history of present illness  ALLERGIES: Allergies  Allergen Reactions  . Erythromycin Nausea And Vomiting    HOME MEDICATIONS: Outpatient Medications Prior to Visit  Medication Sig Dispense Refill  . bisoprolol-hydrochlorothiazide (ZIAC) 2.5-6.25 MG tablet Take 1 tablet by mouth daily.    Marland Kitchen esomeprazole (NEXIUM) 40 MG capsule Take 40 mg by mouth daily at 12 noon.    Marland Kitchen FLUoxetine (PROZAC) 40 MG capsule Take 40 mg by mouth daily.     No facility-administered medications prior to visit.     PAST MEDICAL HISTORY: Past Medical History:  Diagnosis Date  .  Anxiety   . Depression   . GERD (gastroesophageal reflux disease)   . Hypertension   . OSA on CPAP   . Osteoarthritis     PAST SURGICAL HISTORY: Past Surgical History:  Procedure Laterality Date  . LIPOMA EXCISION    . ROTATOR CUFF REPAIR    . TONSILLECTOMY    . uterine fibroidectomy      FAMILY HISTORY: Family History  Problem Relation Age of Onset  . Arthritis Mother   . Asthma Mother   . Depression Mother   . Heart disease Mother   . Hyperlipidemia Mother   . Seizures Mother   . Stroke Mother   . Hypertension Father   . Diabetes Maternal Aunt   . Heart disease Maternal Grandmother     . Breast cancer Maternal Grandmother     SOCIAL HISTORY: Social History   Social History  . Marital status: Married    Spouse name: N/A  . Number of children: N/A  . Years of education: N/A   Occupational History  . Not on file.   Social History Main Topics  . Smoking status: Never Smoker  . Smokeless tobacco: Never Used  . Alcohol use No  . Drug use: No  . Sexual activity: Not on file   Other Topics Concern  . Not on file   Social History Narrative  . No narrative on file      PHYSICAL EXAM  Vitals:   05/16/17 1118  BP: 123/66  Pulse: (!) 58  Weight: 251 lb (113.9 kg)   Body mass index is 40.51 kg/m.  Generalized: Well developed, in no acute distress   Neurological examination  Mentation: Alert oriented to time, place, history taking. Follows all commands speech and language fluent Cranial nerve II-XII: Pupils were equal round reactive to light. Extraocular movements were full, visual field were full on confrontational test. Facial sensation and strength were normal. Uvula tongue midline. Head turning and shoulder shrug  were normal and symmetric. Motor: The motor testing reveals 5 over 5 strength of all 4 extremities. Good symmetric motor tone is noted throughout.  Sensory: Sensory testing is intact to soft touch on all 4 extremities. No evidence of extinction is noted.  Coordination: Cerebellar testing reveals good finger-nose-finger and heel-to-shin bilaterally.  Gait and station: Gait is normal.  Reflexes: Deep tendon reflexes are symmetric and normal bilaterally.   DIAGNOSTIC DATA (LABS, IMAGING, TESTING) - I reviewed patient records, labs, notes, testing and imaging myself where available.     ASSESSMENT AND PLAN 61 y.o. year old female  has a past medical history of Anxiety; Depression; GERD (gastroesophageal reflux disease); Hypertension; OSA on CPAP; and Osteoarthritis. here with :  1. Obstructive sleep apnea on CPAP  The patient CPAP  download shows excellent compliance and good treatment of her apnea. She is encouraged to continue using the CPAP nightly. She is advised that if her symptoms worsen or she develops new symptoms she should let us know. She will follow-up in 1 year or sooner if needed.  I spent 15 minutes with the patient. 50% of this time was spent reviewing CPAP download.     Ward Givens, MSN, NP-C 05/16/2017, 11:27 AM [facility] [address], [place], [place] [address] [phone]

## 2017-05-17 NOTE — Progress Notes (Signed)
I agree with the assessment and plan as directed by NP .The patient is known to me .   Jetson Pickrel, MD  

## 2017-06-12 DIAGNOSIS — G4733 Obstructive sleep apnea (adult) (pediatric): Secondary | ICD-10-CM | POA: Diagnosis not present

## 2017-06-21 DIAGNOSIS — M25561 Pain in right knee: Secondary | ICD-10-CM | POA: Diagnosis not present

## 2017-06-21 DIAGNOSIS — Z23 Encounter for immunization: Secondary | ICD-10-CM | POA: Diagnosis not present

## 2017-06-21 DIAGNOSIS — G4733 Obstructive sleep apnea (adult) (pediatric): Secondary | ICD-10-CM | POA: Diagnosis not present

## 2017-07-12 DIAGNOSIS — G4733 Obstructive sleep apnea (adult) (pediatric): Secondary | ICD-10-CM | POA: Diagnosis not present

## 2017-08-09 DIAGNOSIS — Z0100 Encounter for examination of eyes and vision without abnormal findings: Secondary | ICD-10-CM | POA: Diagnosis not present

## 2017-08-12 DIAGNOSIS — G4733 Obstructive sleep apnea (adult) (pediatric): Secondary | ICD-10-CM | POA: Diagnosis not present

## 2017-09-11 DIAGNOSIS — G4733 Obstructive sleep apnea (adult) (pediatric): Secondary | ICD-10-CM | POA: Diagnosis not present

## 2017-10-12 DIAGNOSIS — G4733 Obstructive sleep apnea (adult) (pediatric): Secondary | ICD-10-CM | POA: Diagnosis not present

## 2017-10-13 DIAGNOSIS — G4733 Obstructive sleep apnea (adult) (pediatric): Secondary | ICD-10-CM | POA: Diagnosis not present

## 2017-10-19 ENCOUNTER — Other Ambulatory Visit: Payer: Self-pay | Admitting: Internal Medicine

## 2017-10-19 DIAGNOSIS — Z139 Encounter for screening, unspecified: Secondary | ICD-10-CM

## 2017-11-08 ENCOUNTER — Ambulatory Visit
Admission: RE | Admit: 2017-11-08 | Discharge: 2017-11-08 | Disposition: A | Discharge status: Home / Self Care | Payer: 59 | Source: Ambulatory Visit | Attending: Internal Medicine | Admitting: Internal Medicine

## 2017-11-08 ENCOUNTER — Encounter (INDEPENDENT_AMBULATORY_CARE_PROVIDER_SITE_OTHER): Payer: Self-pay

## 2017-11-08 DIAGNOSIS — Z1231 Encounter for screening mammogram for malignant neoplasm of breast: Secondary | ICD-10-CM | POA: Diagnosis not present

## 2017-11-08 DIAGNOSIS — Z139 Encounter for screening, unspecified: Secondary | ICD-10-CM

## 2018-03-16 DIAGNOSIS — Z0000 Encounter for general adult medical examination without abnormal findings: Secondary | ICD-10-CM | POA: Diagnosis not present

## 2018-03-16 DIAGNOSIS — R82998 Other abnormal findings in urine: Secondary | ICD-10-CM | POA: Diagnosis not present

## 2018-03-16 DIAGNOSIS — E559 Vitamin D deficiency, unspecified: Secondary | ICD-10-CM | POA: Diagnosis not present

## 2018-03-23 DIAGNOSIS — Z1389 Encounter for screening for other disorder: Secondary | ICD-10-CM | POA: Diagnosis not present

## 2018-03-23 DIAGNOSIS — Z13820 Encounter for screening for osteoporosis: Secondary | ICD-10-CM | POA: Diagnosis not present

## 2018-03-23 DIAGNOSIS — Z0000 Encounter for general adult medical examination without abnormal findings: Secondary | ICD-10-CM | POA: Diagnosis not present

## 2018-03-23 DIAGNOSIS — E559 Vitamin D deficiency, unspecified: Secondary | ICD-10-CM | POA: Diagnosis not present

## 2018-03-23 DIAGNOSIS — I10 Essential (primary) hypertension: Secondary | ICD-10-CM | POA: Diagnosis not present

## 2018-03-23 DIAGNOSIS — M25561 Pain in right knee: Secondary | ICD-10-CM | POA: Diagnosis not present

## 2018-04-01 IMAGING — MG 2D DIGITAL SCREENING BILATERAL MAMMOGRAM WITH 3D TOMO WITH CAD
8 of 12 series · 8 of 28 positions shown · non-contrast
Comparison: Previous exam(s).

CLINICAL DATA: Screening.

EXAM:
2D DIGITAL SCREENING BILATERAL MAMMOGRAM WITH 3D TOMO WITH CAD

[R CC synth-2D]
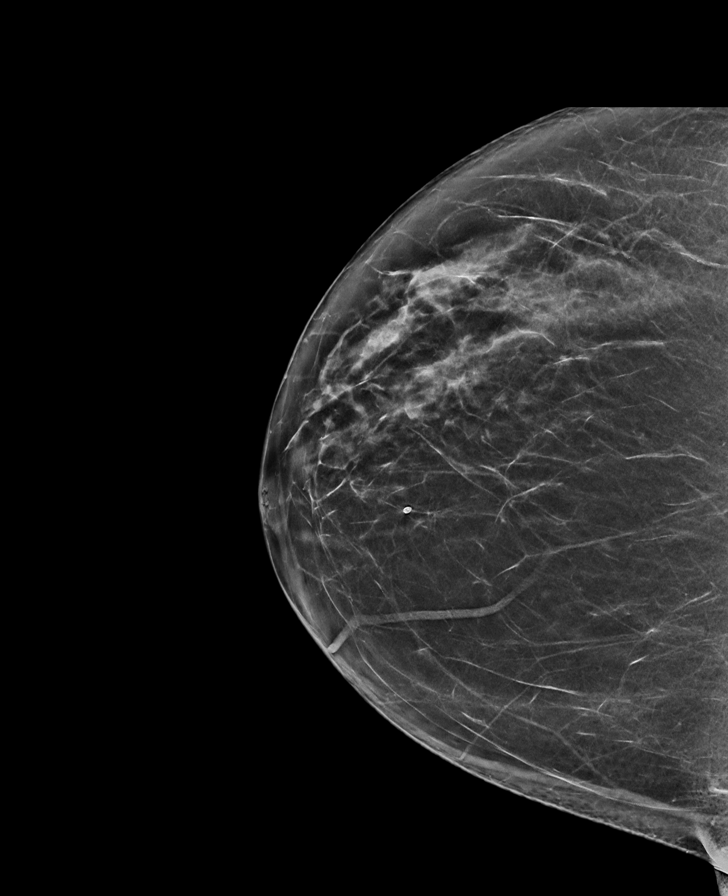

[R CC]
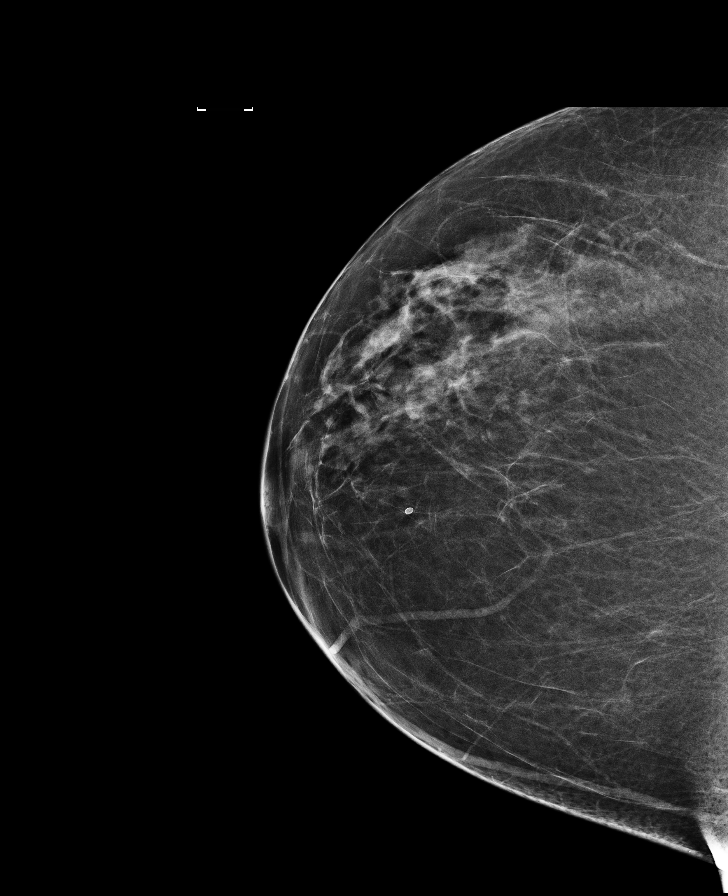

[L MLO]
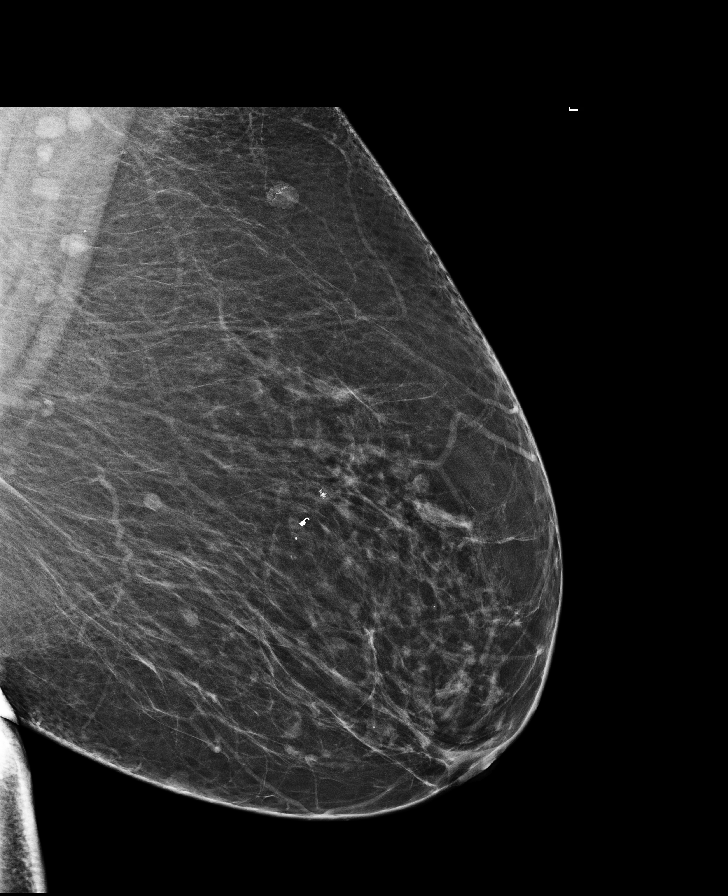

[L CC]
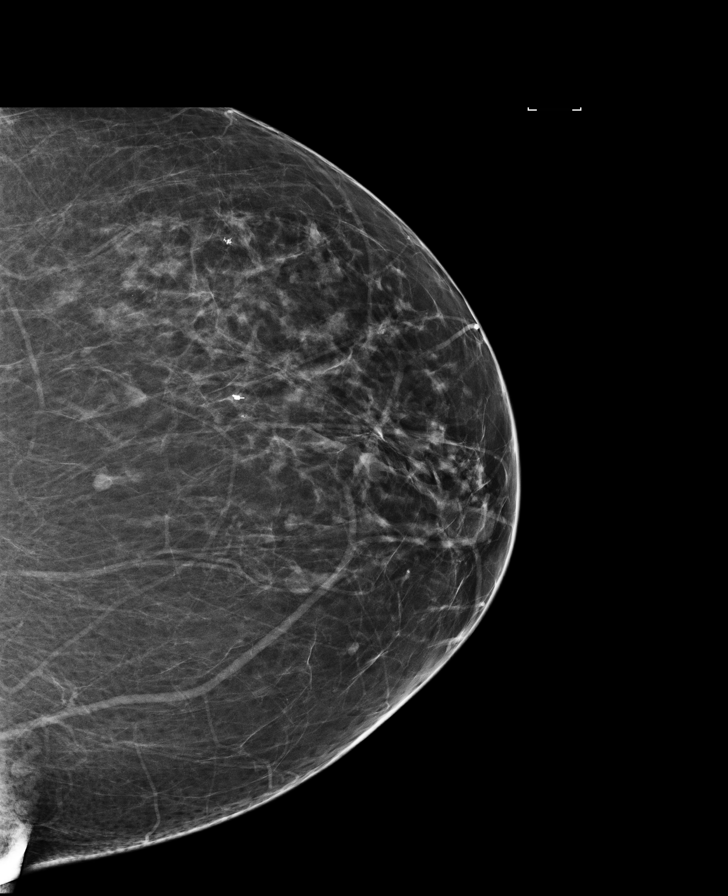

[R MLO]
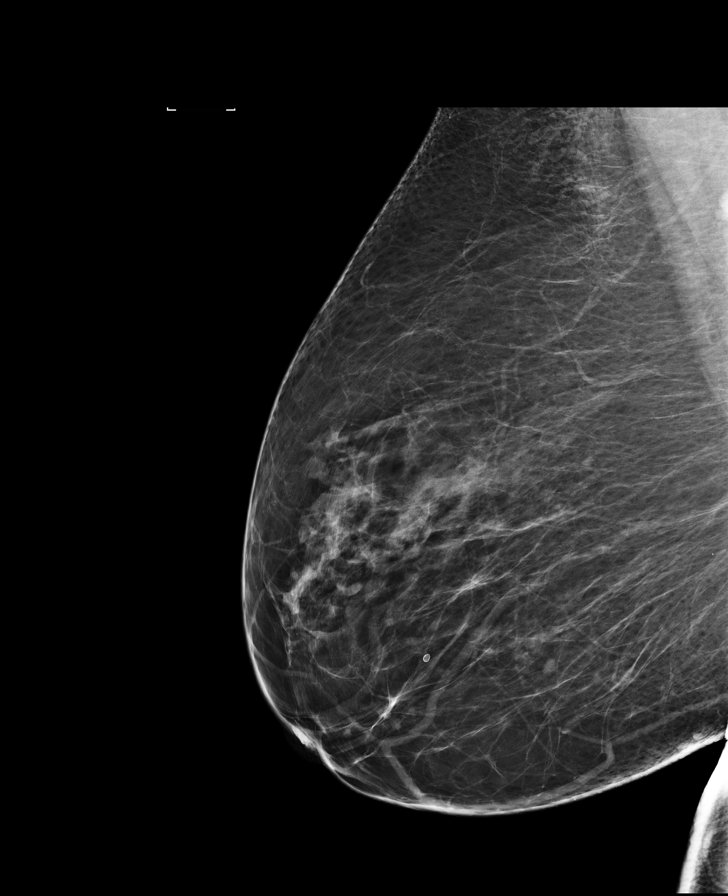

[L CC synth-2D]
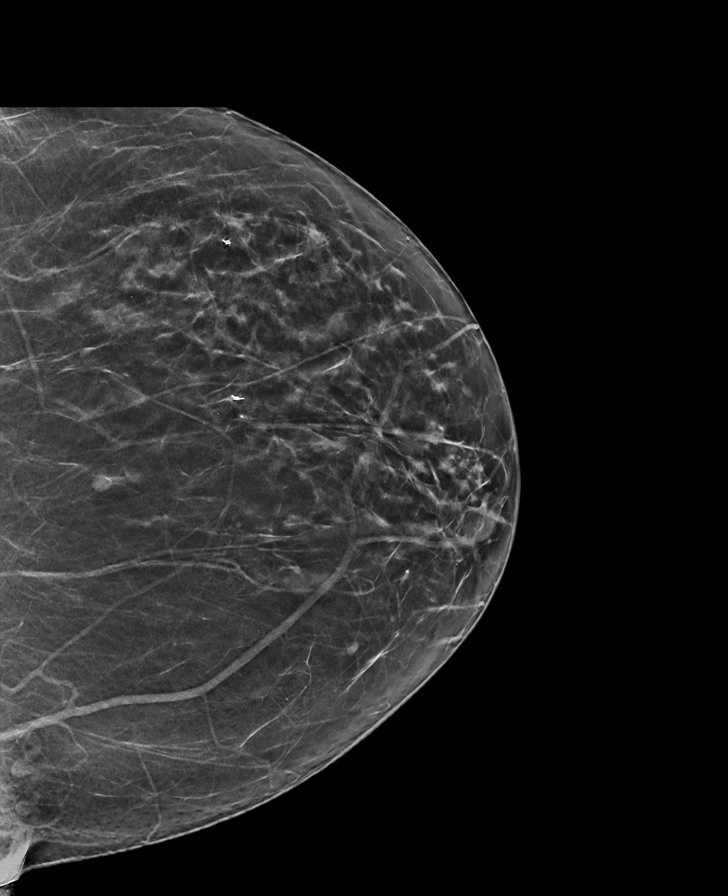

[R MLO synth-2D]
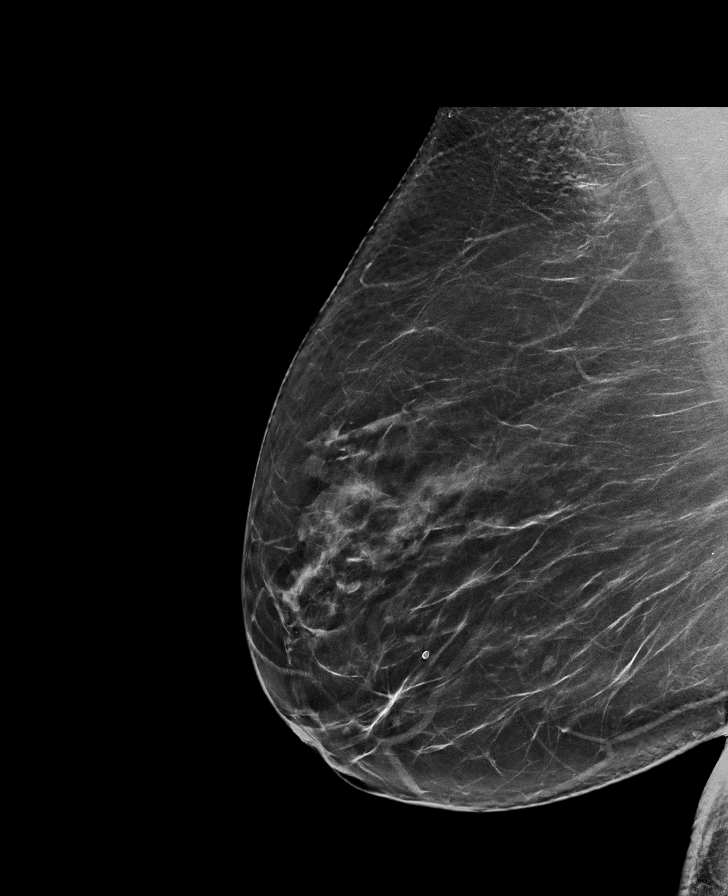

[L MLO synth-2D]
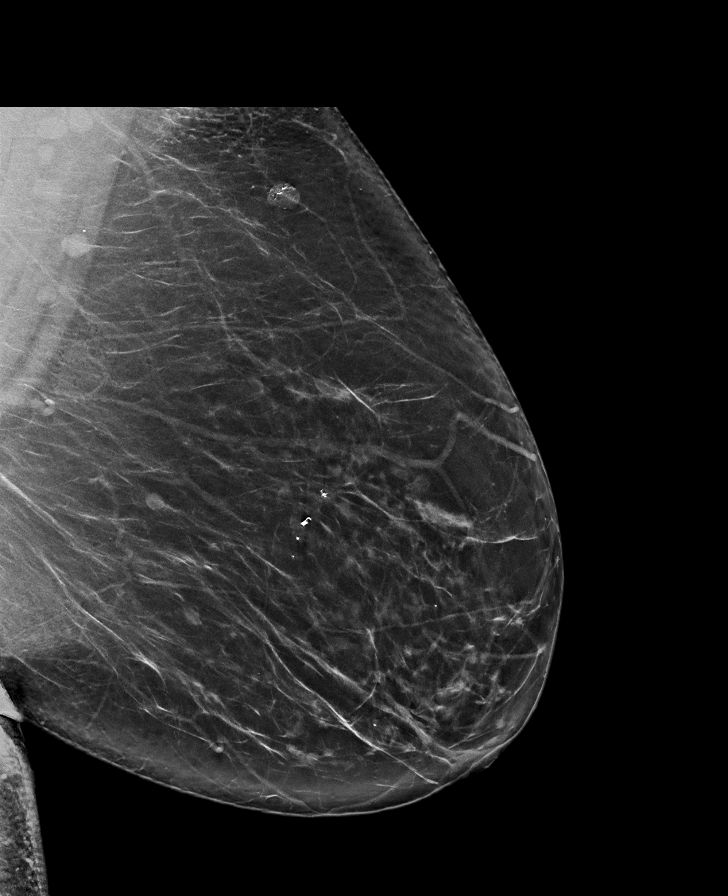

[8 of 28 positions shown; findings below may reference images not displayed]

ACR Breast Density Category b: There are scattered areas of
fibroglandular density.
FINDINGS: There are no findings suspicious for malignancy. Images were
processed with CAD.
IMPRESSION: No mammographic evidence of malignancy. A result letter of this
screening mammogram will be mailed directly to the patient.

RECOMMENDATION:
Screening mammogram in one year. (Code:[id number]-P-ZS0)

BI-RADS CATEGORY  1: Negative.

## 2018-05-14 ENCOUNTER — Encounter: Payer: Self-pay | Admitting: Adult Health

## 2018-05-16 ENCOUNTER — Ambulatory Visit: Payer: 59 | Admitting: Adult Health

## 2018-05-16 ENCOUNTER — Encounter: Payer: Self-pay | Admitting: Adult Health

## 2018-05-16 VITALS — BP 156/75 | HR 54 | Ht 66.0 in | Wt 249.4 lb

## 2018-05-16 DIAGNOSIS — Z9989 Dependence on other enabling machines and devices: Secondary | ICD-10-CM

## 2018-05-16 DIAGNOSIS — G4733 Obstructive sleep apnea (adult) (pediatric): Secondary | ICD-10-CM

## 2018-05-16 NOTE — Patient Instructions (Signed)
Your Plan:  Continue using CPAP nightly and >4 hours each night If your symptoms worsen or you develop new symptoms please let us know.   Thank you for coming to see us at [facility]. I hope we have been able to provide you high quality care today.  You may receive a patient satisfaction survey over the next few weeks. We would appreciate your feedback and comments so that we may continue to improve ourselves and the health of our patients.  

## 2018-05-16 NOTE — Progress Notes (Signed)
PATIENT: Brittany Henson DOB: February 06, 1956  REASON FOR VISIT: follow up HISTORY FROM: patient  HISTORY OF PRESENT ILLNESS: Today 05/16/18:  Brittany Henson is a 62 year old female with a history of obstructive sleep apnea on CPAP.  She returns today for follow-up.  Her download indicates that she use her machine nightly and greater than 4 hours each night.  On average she uses her machine 9 hours and 51 minutes.  Her residual AHI is 1.5 on 5 to 20 cm of water.  Her leak in the 95th percentile is 16.8 L/min.  She continues to see the benefit with using the CPAP.  She returns today for an evaluation.  HISTORY 05/16/17 Brittany Henson is a 62 year old female with a history of obstructive sleep apnea on CPAP. She returns today for follow-up. Her download indicates that she uses her machine 30 out of 30 days for compliance of 90%. She uses her machine greater than 4 hours each night. On average she uses her machine 10 hours and 2 minutes. She is on a minimum pressure of 5 cm of water and max pressure 20 cm of water. Her residual AHI is 2.8. She does not have a significant leak. She states that the CPAP continues to work well for her. She reports that if she does not use it one night she feels terrible the next day. She did have an overnight pulse oximetry that was normal. She returns today for an evaluation.     REVIEW OF SYSTEMS: Out of a complete 14 system review of symptoms, the patient complains only of the following symptoms, and all other reviewed systems are negative.  See HPI ESS 1  ALLERGIES: Allergies  Allergen Reactions  . Erythromycin Nausea And Vomiting    HOME MEDICATIONS: Outpatient Medications Prior to Visit  Medication Sig Dispense Refill  . bisoprolol-hydrochlorothiazide (ZIAC) 2.5-6.25 MG tablet Take 1 tablet by mouth daily.    Marland Kitchen escitalopram (LEXAPRO) 20 MG tablet Take 20 mg by mouth daily.  12  . esomeprazole (NEXIUM) 40 MG capsule Take 40 mg by mouth daily at 12 noon.      No facility-administered medications prior to visit.     PAST MEDICAL HISTORY: Past Medical History:  Diagnosis Date  . Anxiety   . Depression   . GERD (gastroesophageal reflux disease)   . Hypertension   . OSA on CPAP   . Osteoarthritis     PAST SURGICAL HISTORY: Past Surgical History:  Procedure Laterality Date  . BREAST BIOPSY Left 2018  . LIPOMA EXCISION    . ROTATOR CUFF REPAIR    . TONSILLECTOMY    . uterine fibroidectomy      FAMILY HISTORY: Family History  Problem Relation Age of Onset  . Arthritis Mother   . Asthma Mother   . Depression Mother   . Heart disease Mother   . Hyperlipidemia Mother   . Seizures Mother   . Stroke Mother   . Hypertension Father   . Diabetes Maternal Aunt   . Heart disease Maternal Grandmother   . Breast cancer Maternal Grandmother     SOCIAL HISTORY: Social History   Socioeconomic History  . Marital status: Married    Spouse name: Not on file  . Number of children: Not on file  . Years of education: Not on file  . Highest education level: Not on file  Occupational History  . Not on file  Social Needs  . Financial resource strain: Not on file  . Food insecurity:  Worry: Not on file    Inability: Not on file  . Transportation needs:    Medical: Not on file    Non-medical: Not on file  Tobacco Use  . Smoking status: Never Smoker  . Smokeless tobacco: Never Used  Substance and Sexual Activity  . Alcohol use: No  . Drug use: No  . Sexual activity: Not on file  Lifestyle  . Physical activity:    Days per week: Not on file    Minutes per session: Not on file  . Stress: Not on file  Relationships  . Social connections:    Talks on phone: Not on file    Gets together: Not on file    Attends religious service: Not on file    Active member of club or organization: Not on file    Attends meetings of clubs or organizations: Not on file    Relationship status: Not on file  . Intimate partner violence:    Fear  of current or ex partner: Not on file    Emotionally abused: Not on file    Physically abused: Not on file    Forced sexual activity: Not on file  Other Topics Concern  . Not on file  Social History Narrative  . Not on file      PHYSICAL EXAM  Vitals:   05/16/18 1103  BP: (!) 156/75  Pulse: (!) 54  Weight: 249 lb 6.4 oz (113.1 kg)  Height: 5\' 6"  (1.676 m)   Body mass index is 40.25 kg/m.  Generalized: Well developed, in no acute distress   Neurological examination  Mentation: Alert oriented to time, place, history taking. Follows all commands speech and language fluent Cranial nerve II-XII: Pupils were equal round reactive to light. Extraocular movements were full, visual field were full on confrontational test. Facial sensation and strength were normal. Uvula tongue midline. Head turning and shoulder shrug  were normal and symmetric.  Mallampati 4+  motor: The motor testing reveals 5 over 5 strength of all 4 extremities. Good symmetric motor tone is noted throughout.  Sensory: Sensory testing is intact to soft touch on all 4 extremities. No evidence of extinction is noted.  Coordination: Cerebellar testing reveals good finger-nose-finger and heel-to-shin bilaterally.  Gait and station: Gait is normal. Tandem gait is normal. Romberg is negative. No drift is seen.  Reflexes: Deep tendon reflexes are symmetric and normal bilaterally.   DIAGNOSTIC DATA (LABS, IMAGING, TESTING) - I reviewed patient records, labs, notes, testing and imaging myself where available.     ASSESSMENT AND PLAN 62 y.o. year old female  has a past medical history of Anxiety, Depression, GERD (gastroesophageal reflux disease), Hypertension, OSA on CPAP, and Osteoarthritis. here with :  1.  Obstructive sleep apnea on CPAP  The patient CPAP download shows excellent compliance and good treatment of her apnea.  She is encouraged to continue using the CPAP nightly and greater than 4 hours each night.  She  is advised that if her symptoms worsen or she develops new symptoms she should let us know.  She will follow-up in 6 months or sooner if needed.  I spent 15 minutes with the patient. 50% of this time was spent reviewing CPAP download   Ward Givens, MSN, NP-C 05/16/2018, 11:12 AM [facility] [address] [place], [place] [address] [phone]

## 2018-06-14 NOTE — Progress Notes (Signed)
I agree with the assessment and plan as directed by NP .The patient is known to me .   Yoniel Arkwright, MD  

## 2018-07-03 DIAGNOSIS — Z23 Encounter for immunization: Secondary | ICD-10-CM | POA: Diagnosis not present

## 2018-07-13 DIAGNOSIS — G4733 Obstructive sleep apnea (adult) (pediatric): Secondary | ICD-10-CM | POA: Diagnosis not present

## 2018-07-26 DIAGNOSIS — B078 Other viral warts: Secondary | ICD-10-CM | POA: Diagnosis not present

## 2018-07-26 DIAGNOSIS — I781 Nevus, non-neoplastic: Secondary | ICD-10-CM | POA: Diagnosis not present

## 2018-07-26 DIAGNOSIS — L918 Other hypertrophic disorders of the skin: Secondary | ICD-10-CM | POA: Diagnosis not present

## 2018-09-27 DIAGNOSIS — Z23 Encounter for immunization: Secondary | ICD-10-CM | POA: Diagnosis not present

## 2018-11-13 ENCOUNTER — Other Ambulatory Visit: Payer: Self-pay | Admitting: Internal Medicine

## 2018-11-13 DIAGNOSIS — Z1231 Encounter for screening mammogram for malignant neoplasm of breast: Secondary | ICD-10-CM

## 2018-11-27 DIAGNOSIS — Z23 Encounter for immunization: Secondary | ICD-10-CM | POA: Diagnosis not present

## 2018-11-29 DIAGNOSIS — G4733 Obstructive sleep apnea (adult) (pediatric): Secondary | ICD-10-CM | POA: Diagnosis not present

## 2018-12-13 ENCOUNTER — Ambulatory Visit
Admission: RE | Admit: 2018-12-13 | Discharge: 2018-12-13 | Disposition: A | Discharge status: Home / Self Care | Payer: 59 | Source: Ambulatory Visit | Attending: Internal Medicine | Admitting: Internal Medicine

## 2018-12-13 DIAGNOSIS — Z1231 Encounter for screening mammogram for malignant neoplasm of breast: Secondary | ICD-10-CM

## 2019-02-01 DIAGNOSIS — G4733 Obstructive sleep apnea (adult) (pediatric): Secondary | ICD-10-CM | POA: Diagnosis not present

## 2019-05-21 ENCOUNTER — Encounter: Payer: Self-pay | Admitting: Adult Health

## 2019-05-21 ENCOUNTER — Other Ambulatory Visit: Payer: Self-pay

## 2019-05-21 ENCOUNTER — Ambulatory Visit: Payer: 59 | Admitting: Adult Health

## 2019-05-21 VITALS — BP 146/90 | HR 47 | Temp 96.4°F | Ht 66.0 in | Wt 253.2 lb

## 2019-05-21 DIAGNOSIS — Z9989 Dependence on other enabling machines and devices: Secondary | ICD-10-CM

## 2019-05-21 DIAGNOSIS — G4733 Obstructive sleep apnea (adult) (pediatric): Secondary | ICD-10-CM | POA: Diagnosis not present

## 2019-05-21 NOTE — Patient Instructions (Signed)
Continue using CPAP nightly and greater than 4 hours each night °If your symptoms worsen or you develop new symptoms please let us know.  ° °

## 2019-05-21 NOTE — Progress Notes (Signed)
PATIENT: Brittany Henson DOB: 14-Feb-1956  REASON FOR VISIT: follow up HISTORY FROM: patient  HISTORY OF PRESENT ILLNESS: Today 05/21/19:  Brittany Henson is a 63 year old female with a history of obstructive sleep apnea on CPAP.  She returns today for follow-up.  Her CPAP download indicates that she use her machine nightly for compliance of 100%.  She used her machine greater than 4 hours each night.  On average she uses her machine 9 hours and 21 minutes.  Her residual AHI is 2.6 on 5 to 20 cm of water with EPR of 2.  Her leak in the 95th percentile is 9.9 L/min.  Overall she is doing well.  Denies any new issues.  Continues to see the benefit with using the CPAP.  HISTORY 05/16/18:  Brittany Henson is a 63 year old female with a history of obstructive sleep apnea on CPAP.  She returns today for follow-up.  Her download indicates that she use her machine nightly and greater than 4 hours each night.  On average she uses her machine 9 hours and 51 minutes.  Her residual AHI is 1.5 on 5 to 20 cm of water.  Her leak in the 95th percentile is 16.8 L/min.  She continues to see the benefit with using the CPAP.  She returns today for an evaluation.  REVIEW OF SYSTEMS: Out of a complete 14 system review of symptoms, the patient complains only of the following symptoms, and all other reviewed systems are negative.  ALLERGIES: Allergies  Allergen Reactions  . Erythromycin Nausea And Vomiting    HOME MEDICATIONS: Outpatient Medications Prior to Visit  Medication Sig Dispense Refill  . bisoprolol-hydrochlorothiazide (ZIAC) 2.5-6.25 MG tablet Take 1 tablet by mouth daily.    . cholecalciferol (VITAMIN D3) 25 MCG (1000 UT) tablet Take 2,000 Units by mouth daily. OTC    . cyanocobalamin 1000 MCG tablet Take 1,000 mcg by mouth daily. OTC    . escitalopram (LEXAPRO) 20 MG tablet Take 20 mg by mouth daily.  12  . esomeprazole (NEXIUM) 40 MG capsule Take 40 mg by mouth daily at 12 noon.     No  facility-administered medications prior to visit.     PAST MEDICAL HISTORY: Past Medical History:  Diagnosis Date  . Anxiety   . Depression   . GERD (gastroesophageal reflux disease)   . Hypertension   . OSA on CPAP   . Osteoarthritis     PAST SURGICAL HISTORY: Past Surgical History:  Procedure Laterality Date  . BREAST BIOPSY Left 2018  . LIPOMA EXCISION    . ROTATOR CUFF REPAIR    . TONSILLECTOMY    . uterine fibroidectomy      FAMILY HISTORY: Family History  Problem Relation Age of Onset  . Arthritis Mother   . Asthma Mother   . Depression Mother   . Heart disease Mother   . Hyperlipidemia Mother   . Seizures Mother   . Stroke Mother   . Hypertension Father   . Diabetes Maternal Aunt   . Heart disease Maternal Grandmother   . Breast cancer Maternal Grandmother     SOCIAL HISTORY: Social History   Socioeconomic History  . Marital status: Married    Spouse name: Not on file  . Number of children: Not on file  . Years of education: Not on file  . Highest education level: Not on file  Occupational History  . Not on file  Social Needs  . Financial resource strain: Not on file  .  Food insecurity    Worry: Not on file    Inability: Not on file  . Transportation needs    Medical: Not on file    Non-medical: Not on file  Tobacco Use  . Smoking status: Never Smoker  . Smokeless tobacco: Never Used  Substance and Sexual Activity  . Alcohol use: No  . Drug use: No  . Sexual activity: Not on file  Lifestyle  . Physical activity    Days per week: Not on file    Minutes per session: Not on file  . Stress: Not on file  Relationships  . Social Herbalist on phone: Not on file    Gets together: Not on file    Attends religious service: Not on file    Active member of club or organization: Not on file    Attends meetings of clubs or organizations: Not on file    Relationship status: Not on file  . Intimate partner violence    Fear of current  or ex partner: Not on file    Emotionally abused: Not on file    Physically abused: Not on file    Forced sexual activity: Not on file  Other Topics Concern  . Not on file  Social History Narrative  . Not on file      PHYSICAL EXAM  Vitals:   05/21/19 1119  BP: (!) 146/90  Pulse: (!) 47  Temp: (!) 96.4 F (35.8 C)  Weight: 253 lb 3.2 oz (114.9 kg)  Height: 5\' 6"  (1.676 m)   Body mass index is 40.87 kg/m.  Generalized: Well developed, in no acute distress   Neurological examination  Mentation: Alert oriented to time, place, history taking. Follows all commands speech and language fluent Cranial nerve II-XII: Pupils were equal round reactive to light. Extraocular movements were full, visual field were full on confrontational test.Head turning and shoulder shrug  were normal and symmetric. Motor: The motor testing reveals 5 over 5 strength of all 4 extremities. Good symmetric motor tone is noted throughout.  Sensory: Sensory testing is intact to soft touch on all 4 extremities. No evidence of extinction is noted.  Coordination: Cerebellar testing reveals good finger-nose-finger and heel-to-shin bilaterally.  Gait and station: Gait is normal.     DIAGNOSTIC DATA (LABS, IMAGING, TESTING) - I reviewed patient records, labs, notes, testing and imaging myself where available.     ASSESSMENT AND PLAN 63 y.o. year old female  has a past medical history of Anxiety, Depression, GERD (gastroesophageal reflux disease), Hypertension, OSA on CPAP, and Osteoarthritis. here with:  1.  Obstructive sleep apnea on CPAP  Patient CPAP download shows excellent compliance and good treatment of her apnea.  She is encouraged to continue using CPAP nightly and greater than 4 hours each night.  She is advised that if her symptoms worsen or she develops new symptoms she should let us know.  She will follow-up in 1 year or sooner if needed.    I spent 15 minutes with the patient. 50% of this  time was spent reviewing CPAP download   Ward Givens, MSN, NP-C 05/21/2019, 11:33 AM [facility] [address], [place], [place] [address] [phone]

## 2020-05-21 ENCOUNTER — Ambulatory Visit: Payer: 59 | Admitting: Adult Health

## 2021-07-01 ENCOUNTER — Encounter: Payer: Self-pay | Admitting: Adult Health

## 2024-04-17 ENCOUNTER — Telehealth: Payer: Self-pay | Admitting: Adult Health

## 2024-04-17 NOTE — Telephone Encounter (Signed)
 Pt called to speak to [facility] - Pt has not been here in a long time . However PT  is requesting last sleep study results . Pt  would like  to speak to  medical Record. Pt has  new  Doctor and they are requesting those results .Pt didn't give fax information because pt want to to coordinator .
